# Patient Record
Sex: Male | Born: 1955 | Race: White | Hispanic: No | Marital: Married | Smoking: Never smoker
Health system: Southern US, Community
[De-identification: ages and names within clinical notes are randomized; demographics above are authoritative.]

## PROBLEM LIST (undated history)

## (undated) DIAGNOSIS — E785 Hyperlipidemia, unspecified: Secondary | ICD-10-CM

## (undated) DIAGNOSIS — M5430 Sciatica, unspecified side: Secondary | ICD-10-CM

## (undated) DIAGNOSIS — J309 Allergic rhinitis, unspecified: Secondary | ICD-10-CM

## (undated) DIAGNOSIS — M1990 Unspecified osteoarthritis, unspecified site: Secondary | ICD-10-CM

## (undated) DIAGNOSIS — F419 Anxiety disorder, unspecified: Secondary | ICD-10-CM

## (undated) DIAGNOSIS — G4700 Insomnia, unspecified: Secondary | ICD-10-CM

## (undated) DIAGNOSIS — I10 Essential (primary) hypertension: Secondary | ICD-10-CM

## (undated) HISTORY — DX: Sciatica, unspecified side: M54.30

## (undated) HISTORY — DX: Hyperlipidemia, unspecified: E78.5

## (undated) HISTORY — DX: Insomnia, unspecified: G47.00

## (undated) HISTORY — PX: LUMBAR LAMINECTOMY: SHX95

## (undated) HISTORY — DX: Essential (primary) hypertension: I10

## (undated) HISTORY — DX: Unspecified osteoarthritis, unspecified site: M19.90

## (undated) HISTORY — DX: Allergic rhinitis, unspecified: J30.9

## (undated) HISTORY — DX: Anxiety disorder, unspecified: F41.9

---
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
## 1999-03-03 ENCOUNTER — Ambulatory Visit (HOSPITAL_BASED_OUTPATIENT_CLINIC_OR_DEPARTMENT_OTHER): Admission: RE | Admit: 1999-03-03 | Discharge: 1999-03-03 | Payer: Self-pay | Admitting: *Deleted

## 1999-03-08 ENCOUNTER — Emergency Department (HOSPITAL_COMMUNITY): Admission: EM | Admit: 1999-03-08 | Discharge: 1999-03-08 | Payer: Self-pay | Admitting: Emergency Medicine

## 1999-03-08 ENCOUNTER — Encounter: Payer: Self-pay | Admitting: Emergency Medicine

## 2000-04-15 ENCOUNTER — Encounter: Admission: RE | Admit: 2000-04-15 | Discharge: 2000-04-15 | Payer: Self-pay | Admitting: Family Medicine

## 2000-04-15 ENCOUNTER — Encounter: Payer: Self-pay | Admitting: Family Medicine

## 2000-04-16 ENCOUNTER — Encounter: Payer: Self-pay | Admitting: Family Medicine

## 2000-04-16 ENCOUNTER — Encounter: Admission: RE | Admit: 2000-04-16 | Discharge: 2000-04-16 | Payer: Self-pay | Admitting: Family Medicine

## 2000-04-29 ENCOUNTER — Encounter: Payer: Self-pay | Admitting: Neurosurgery

## 2000-04-29 ENCOUNTER — Ambulatory Visit (HOSPITAL_COMMUNITY): Admission: RE | Admit: 2000-04-29 | Discharge: 2000-04-29 | Payer: Self-pay | Admitting: Neurosurgery

## 2000-05-17 ENCOUNTER — Ambulatory Visit (HOSPITAL_COMMUNITY): Admission: RE | Admit: 2000-05-17 | Discharge: 2000-05-17 | Payer: Self-pay | Admitting: Neurosurgery

## 2000-05-17 ENCOUNTER — Encounter: Payer: Self-pay | Admitting: Neurosurgery

## 2000-05-31 ENCOUNTER — Ambulatory Visit (HOSPITAL_COMMUNITY): Admission: RE | Admit: 2000-05-31 | Discharge: 2000-05-31 | Payer: Self-pay | Admitting: Neurosurgery

## 2000-05-31 ENCOUNTER — Encounter: Payer: Self-pay | Admitting: Neurosurgery

## 2000-06-17 ENCOUNTER — Ambulatory Visit (HOSPITAL_COMMUNITY): Admission: RE | Admit: 2000-06-17 | Discharge: 2000-06-17 | Payer: Self-pay | Admitting: Neurosurgery

## 2000-06-17 ENCOUNTER — Encounter: Payer: Self-pay | Admitting: Neurosurgery

## 2004-08-28 ENCOUNTER — Ambulatory Visit (HOSPITAL_COMMUNITY): Admission: RE | Admit: 2004-08-28 | Discharge: 2004-08-28 | Payer: Self-pay | Admitting: Family Medicine

## 2014-05-23 ENCOUNTER — Ambulatory Visit
Admission: RE | Admit: 2014-05-23 | Discharge: 2014-05-23 | Disposition: A | Discharge status: Home / Self Care | Payer: BC Managed Care – PPO | Source: Ambulatory Visit | Attending: Family Medicine | Admitting: Family Medicine

## 2014-05-23 ENCOUNTER — Other Ambulatory Visit: Payer: Self-pay

## 2014-05-23 ENCOUNTER — Other Ambulatory Visit: Payer: Self-pay | Admitting: Family Medicine

## 2014-05-23 DIAGNOSIS — R05 Cough: Secondary | ICD-10-CM

## 2014-05-23 DIAGNOSIS — R059 Cough, unspecified: Secondary | ICD-10-CM

## 2015-12-23 DIAGNOSIS — E782 Mixed hyperlipidemia: Secondary | ICD-10-CM | POA: Diagnosis not present

## 2015-12-23 DIAGNOSIS — I10 Essential (primary) hypertension: Secondary | ICD-10-CM | POA: Diagnosis not present

## 2015-12-23 DIAGNOSIS — R05 Cough: Secondary | ICD-10-CM | POA: Diagnosis not present

## 2016-01-02 DIAGNOSIS — Z0100 Encounter for examination of eyes and vision without abnormal findings: Secondary | ICD-10-CM | POA: Diagnosis not present

## 2016-04-21 DIAGNOSIS — Z23 Encounter for immunization: Secondary | ICD-10-CM | POA: Diagnosis not present

## 2016-06-28 DIAGNOSIS — I10 Essential (primary) hypertension: Secondary | ICD-10-CM | POA: Diagnosis not present

## 2016-06-28 DIAGNOSIS — Z0000 Encounter for general adult medical examination without abnormal findings: Secondary | ICD-10-CM | POA: Diagnosis not present

## 2016-06-28 DIAGNOSIS — E782 Mixed hyperlipidemia: Secondary | ICD-10-CM | POA: Diagnosis not present

## 2016-06-28 DIAGNOSIS — R05 Cough: Secondary | ICD-10-CM | POA: Diagnosis not present

## 2016-06-28 DIAGNOSIS — G4700 Insomnia, unspecified: Secondary | ICD-10-CM | POA: Diagnosis not present

## 2017-01-06 DIAGNOSIS — Z0100 Encounter for examination of eyes and vision without abnormal findings: Secondary | ICD-10-CM | POA: Diagnosis not present

## 2017-05-25 DIAGNOSIS — Z23 Encounter for immunization: Secondary | ICD-10-CM | POA: Diagnosis not present

## 2017-07-04 DIAGNOSIS — Z1159 Encounter for screening for other viral diseases: Secondary | ICD-10-CM | POA: Diagnosis not present

## 2017-07-04 DIAGNOSIS — G4700 Insomnia, unspecified: Secondary | ICD-10-CM | POA: Diagnosis not present

## 2017-07-04 DIAGNOSIS — Z1211 Encounter for screening for malignant neoplasm of colon: Secondary | ICD-10-CM | POA: Diagnosis not present

## 2017-07-04 DIAGNOSIS — E782 Mixed hyperlipidemia: Secondary | ICD-10-CM | POA: Diagnosis not present

## 2017-07-04 DIAGNOSIS — I10 Essential (primary) hypertension: Secondary | ICD-10-CM | POA: Diagnosis not present

## 2017-07-04 DIAGNOSIS — Z23 Encounter for immunization: Secondary | ICD-10-CM | POA: Diagnosis not present

## 2017-07-04 DIAGNOSIS — Z0000 Encounter for general adult medical examination without abnormal findings: Secondary | ICD-10-CM | POA: Diagnosis not present

## 2017-08-02 DIAGNOSIS — G4733 Obstructive sleep apnea (adult) (pediatric): Secondary | ICD-10-CM | POA: Diagnosis not present

## 2017-08-12 DIAGNOSIS — G4733 Obstructive sleep apnea (adult) (pediatric): Secondary | ICD-10-CM | POA: Diagnosis not present

## 2017-08-22 DIAGNOSIS — Z1211 Encounter for screening for malignant neoplasm of colon: Secondary | ICD-10-CM | POA: Diagnosis not present

## 2017-08-22 DIAGNOSIS — K5730 Diverticulosis of large intestine without perforation or abscess without bleeding: Secondary | ICD-10-CM | POA: Diagnosis not present

## 2017-08-22 DIAGNOSIS — D120 Benign neoplasm of cecum: Secondary | ICD-10-CM | POA: Diagnosis not present

## 2017-09-12 DIAGNOSIS — G4733 Obstructive sleep apnea (adult) (pediatric): Secondary | ICD-10-CM | POA: Diagnosis not present

## 2017-10-10 DIAGNOSIS — G4733 Obstructive sleep apnea (adult) (pediatric): Secondary | ICD-10-CM | POA: Diagnosis not present

## 2017-11-01 DIAGNOSIS — G4733 Obstructive sleep apnea (adult) (pediatric): Secondary | ICD-10-CM | POA: Diagnosis not present

## 2017-11-17 DIAGNOSIS — G4733 Obstructive sleep apnea (adult) (pediatric): Secondary | ICD-10-CM | POA: Diagnosis not present

## 2017-12-10 DIAGNOSIS — G4733 Obstructive sleep apnea (adult) (pediatric): Secondary | ICD-10-CM | POA: Diagnosis not present

## 2018-01-10 DIAGNOSIS — G4733 Obstructive sleep apnea (adult) (pediatric): Secondary | ICD-10-CM | POA: Diagnosis not present

## 2018-02-09 DIAGNOSIS — G4733 Obstructive sleep apnea (adult) (pediatric): Secondary | ICD-10-CM | POA: Diagnosis not present

## 2018-03-01 DIAGNOSIS — G4733 Obstructive sleep apnea (adult) (pediatric): Secondary | ICD-10-CM | POA: Diagnosis not present

## 2018-03-12 DIAGNOSIS — G4733 Obstructive sleep apnea (adult) (pediatric): Secondary | ICD-10-CM | POA: Diagnosis not present

## 2018-05-02 DIAGNOSIS — Z23 Encounter for immunization: Secondary | ICD-10-CM | POA: Diagnosis not present

## 2018-10-12 DIAGNOSIS — G4700 Insomnia, unspecified: Secondary | ICD-10-CM | POA: Diagnosis not present

## 2018-10-12 DIAGNOSIS — E782 Mixed hyperlipidemia: Secondary | ICD-10-CM | POA: Diagnosis not present

## 2018-10-12 DIAGNOSIS — I10 Essential (primary) hypertension: Secondary | ICD-10-CM | POA: Diagnosis not present

## 2018-10-12 DIAGNOSIS — K219 Gastro-esophageal reflux disease without esophagitis: Secondary | ICD-10-CM | POA: Diagnosis not present

## 2019-02-08 DIAGNOSIS — G4733 Obstructive sleep apnea (adult) (pediatric): Secondary | ICD-10-CM | POA: Diagnosis not present

## 2019-02-13 DIAGNOSIS — H02889 Meibomian gland dysfunction of unspecified eye, unspecified eyelid: Secondary | ICD-10-CM | POA: Diagnosis not present

## 2019-04-23 DIAGNOSIS — Z23 Encounter for immunization: Secondary | ICD-10-CM | POA: Diagnosis not present

## 2019-04-23 DIAGNOSIS — Z125 Encounter for screening for malignant neoplasm of prostate: Secondary | ICD-10-CM | POA: Diagnosis not present

## 2019-04-23 DIAGNOSIS — K219 Gastro-esophageal reflux disease without esophagitis: Secondary | ICD-10-CM | POA: Diagnosis not present

## 2019-04-23 DIAGNOSIS — G4700 Insomnia, unspecified: Secondary | ICD-10-CM | POA: Diagnosis not present

## 2019-04-23 DIAGNOSIS — I10 Essential (primary) hypertension: Secondary | ICD-10-CM | POA: Diagnosis not present

## 2019-04-23 DIAGNOSIS — E782 Mixed hyperlipidemia: Secondary | ICD-10-CM | POA: Diagnosis not present

## 2019-04-24 ENCOUNTER — Other Ambulatory Visit: Payer: Self-pay | Admitting: Family Medicine

## 2019-04-24 DIAGNOSIS — K219 Gastro-esophageal reflux disease without esophagitis: Secondary | ICD-10-CM

## 2019-05-01 ENCOUNTER — Ambulatory Visit
Admission: RE | Admit: 2019-05-01 | Discharge: 2019-05-01 | Disposition: A | Discharge status: Home / Self Care | Payer: BC Managed Care – PPO | Source: Ambulatory Visit | Attending: Family Medicine | Admitting: Family Medicine

## 2019-05-01 ENCOUNTER — Other Ambulatory Visit: Payer: Self-pay | Admitting: Family Medicine

## 2019-05-01 DIAGNOSIS — K224 Dyskinesia of esophagus: Secondary | ICD-10-CM | POA: Diagnosis not present

## 2019-05-01 DIAGNOSIS — I10 Essential (primary) hypertension: Secondary | ICD-10-CM | POA: Diagnosis not present

## 2019-05-01 DIAGNOSIS — K219 Gastro-esophageal reflux disease without esophagitis: Secondary | ICD-10-CM

## 2019-05-01 DIAGNOSIS — K2100 Gastro-esophageal reflux disease with esophagitis, without bleeding: Secondary | ICD-10-CM | POA: Diagnosis not present

## 2019-05-01 DIAGNOSIS — K225 Diverticulum of esophagus, acquired: Secondary | ICD-10-CM | POA: Diagnosis not present

## 2019-09-24 DIAGNOSIS — G4733 Obstructive sleep apnea (adult) (pediatric): Secondary | ICD-10-CM | POA: Diagnosis not present

## 2019-10-16 DIAGNOSIS — G4700 Insomnia, unspecified: Secondary | ICD-10-CM | POA: Diagnosis not present

## 2019-10-16 DIAGNOSIS — Z125 Encounter for screening for malignant neoplasm of prostate: Secondary | ICD-10-CM | POA: Diagnosis not present

## 2019-10-16 DIAGNOSIS — Z0000 Encounter for general adult medical examination without abnormal findings: Secondary | ICD-10-CM | POA: Diagnosis not present

## 2019-10-16 DIAGNOSIS — E782 Mixed hyperlipidemia: Secondary | ICD-10-CM | POA: Diagnosis not present

## 2019-10-16 DIAGNOSIS — K219 Gastro-esophageal reflux disease without esophagitis: Secondary | ICD-10-CM | POA: Diagnosis not present

## 2019-10-16 DIAGNOSIS — I10 Essential (primary) hypertension: Secondary | ICD-10-CM | POA: Diagnosis not present

## 2020-06-15 IMAGING — RF DG ESOPHAGUS
10 of 11 series · 14 of 24 positions shown · non-contrast
Comparison: None.

CLINICAL DATA: Gastroesophageal reflux disease. Excessive throat
clearing with sensation of food sticking in the lower chest.

EXAM:
ESOPHOGRAM / BARIUM SWALLOW / BARIUM TABLET STUDY
TECHNIQUE: Combined double contrast and single contrast examination performed
using effervescent crystals, thick barium liquid, and thin barium
liquid. The patient was observed with fluoroscopy swallowing a 13 mm
barium sulphate tablet.
FLUOROSCOPY TIME:  Fluoroscopy Time:  2 minutes 12 seconds
Radiation Exposure Index (if provided by the fluoroscopic device):
91 mGy
Number of Acquired Spot Images: 10

[Series 1: sequence · 1 of 16 frames shown (1 of 7)]
[frame 3/16]
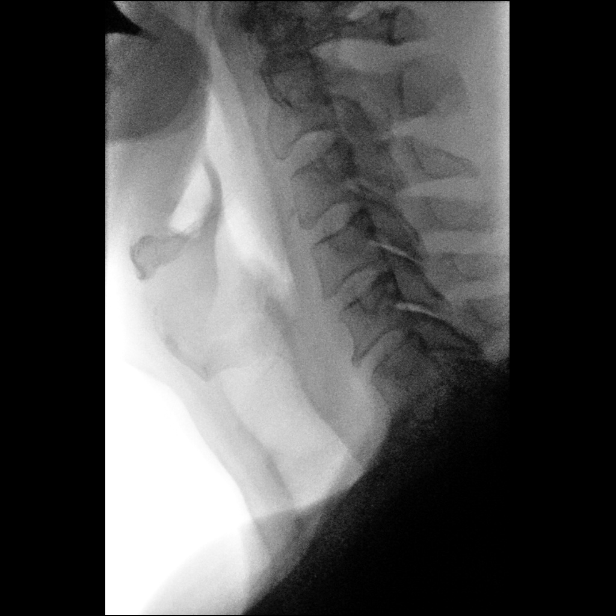

[Series 2: one shot · 0.15mm/px · 1 of 1 slices shown (1 of 3)]
[im 1/1]
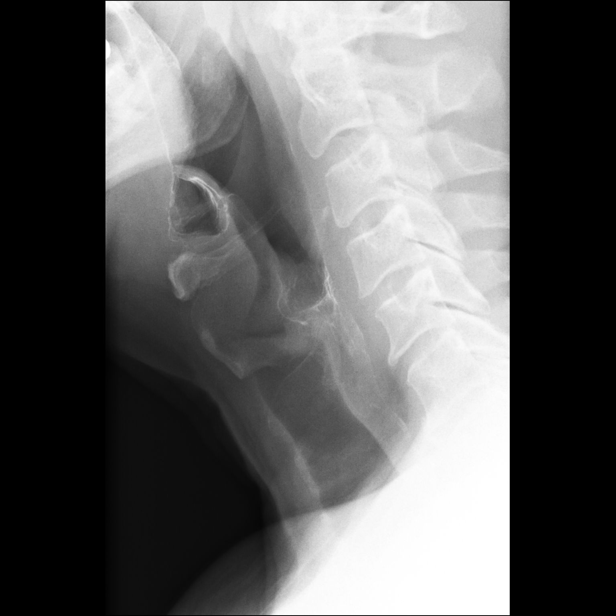

[Series 3: sequence · 1 of 21 frames shown (2 of 7)]
[frame 18/21]
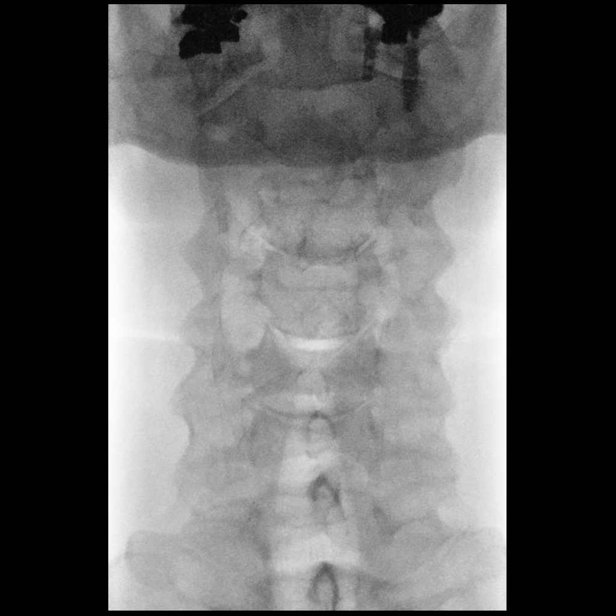

[Series 4: one shot · 0.15mm/px · 3 of 8 slices shown (2 of 3)]
[im 2/8]
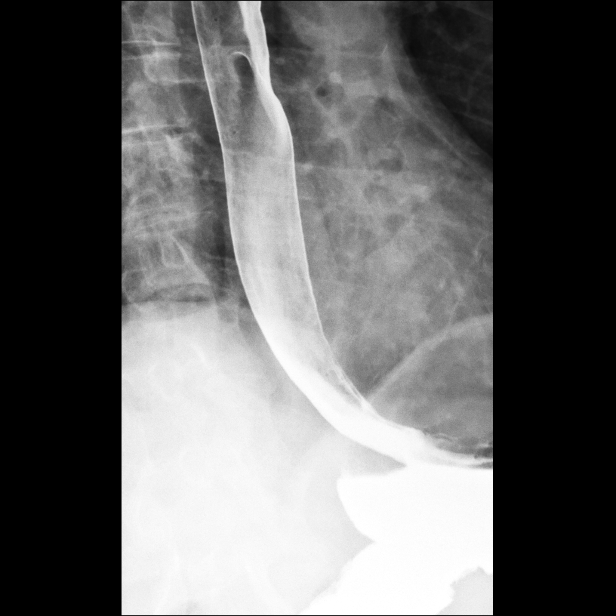
[im 4/8]
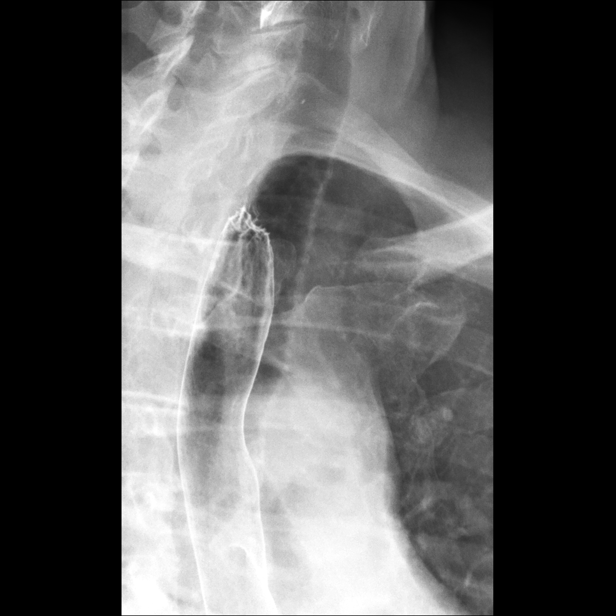
[im 7/8]
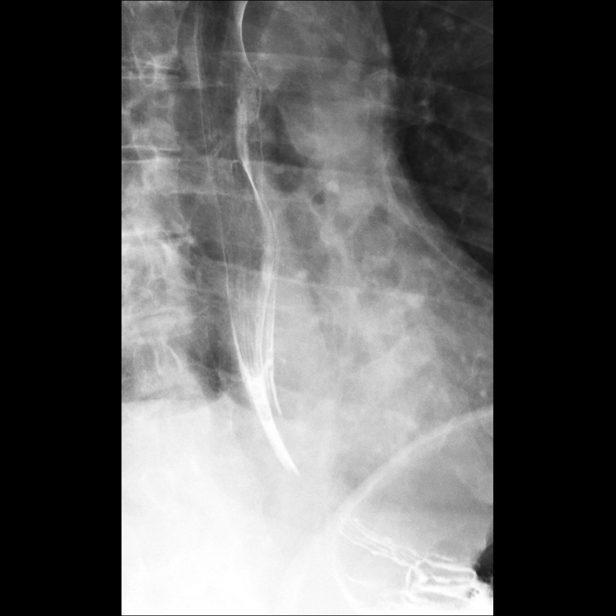

[Series 5: sequence · 1 of 27 frames shown (3 of 7)]
[frame 14/27]
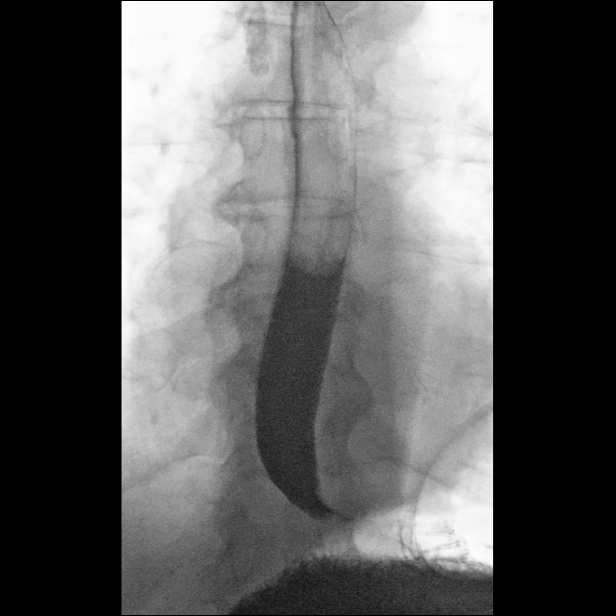

[Series 6: one shot · 0.15mm/px · 1 of 1 slices shown (3 of 3)]
[im 1/1]
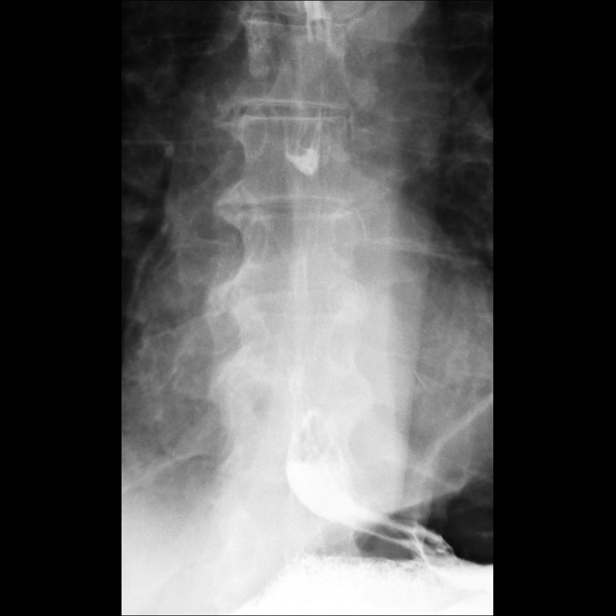

[Series 7: sequence · 1 of 4 frames shown (4 of 7)]
[frame 4/4]
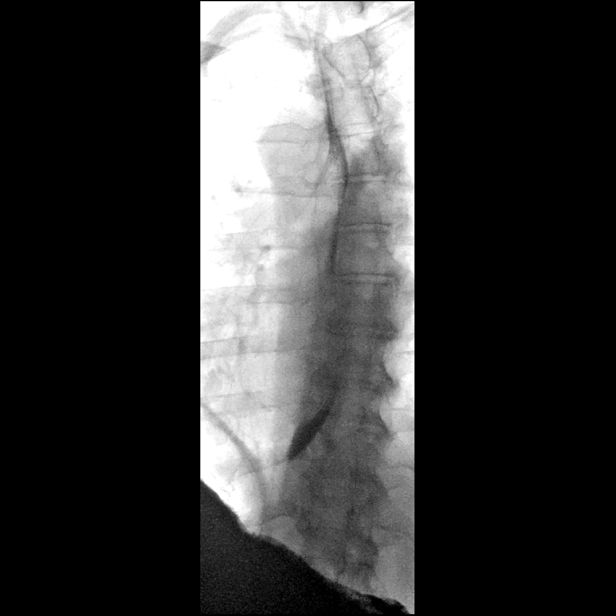

[Series 8: sequence · 1 of 51 frames shown (5 of 7)]
[frame 26/51]
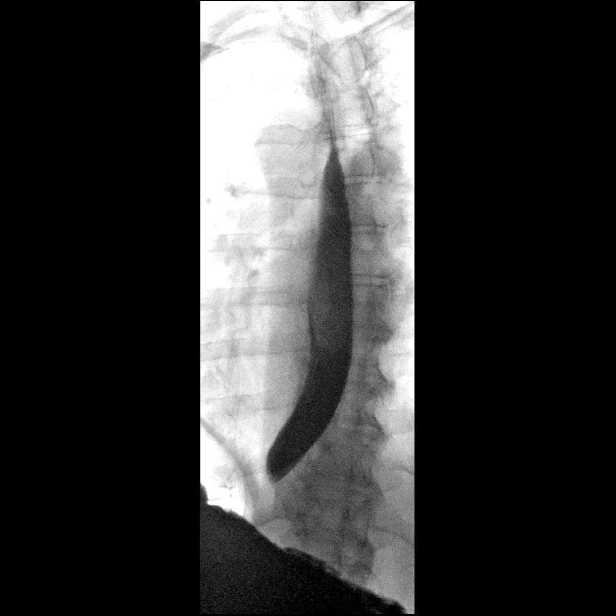

[Series 9: sequence · 2 of 114 frames shown (6 of 7)]
[frame 18/114]
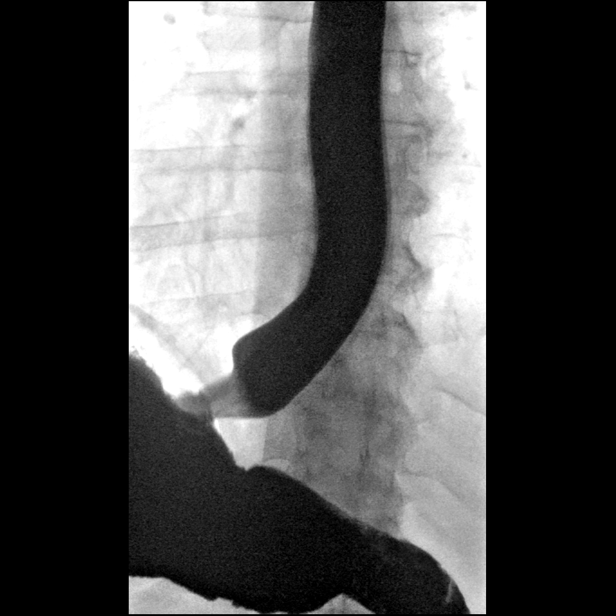
[frame 97/114]
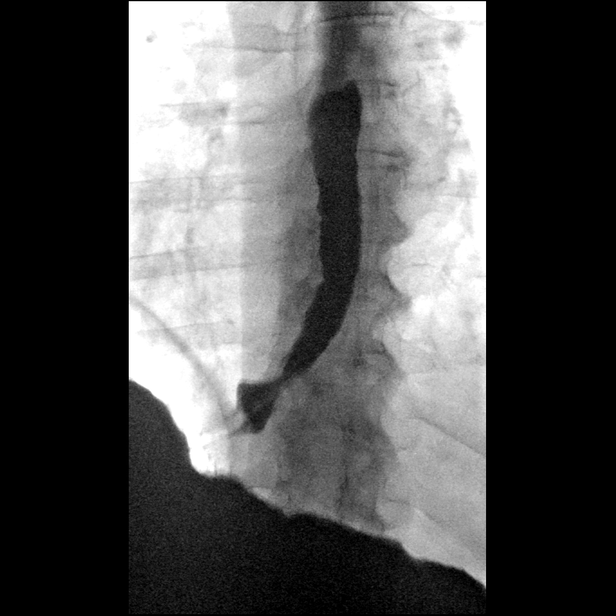

[Series 12: sequence · 2 of 15 frames shown (7 of 7)]
[frame 3/15]
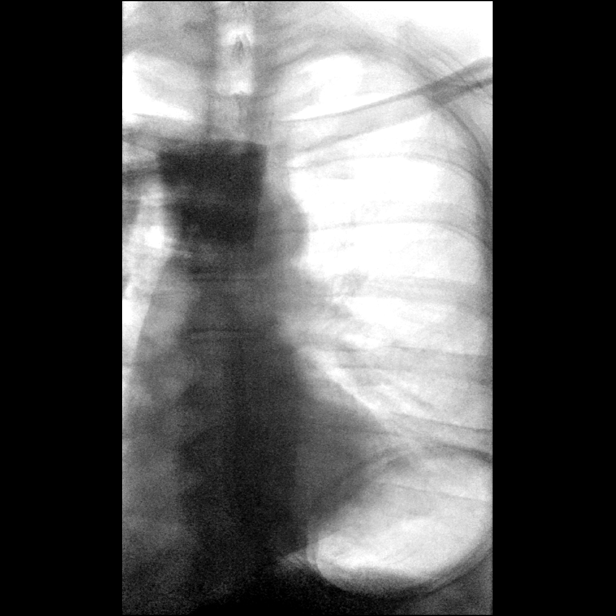
[frame 15/15]
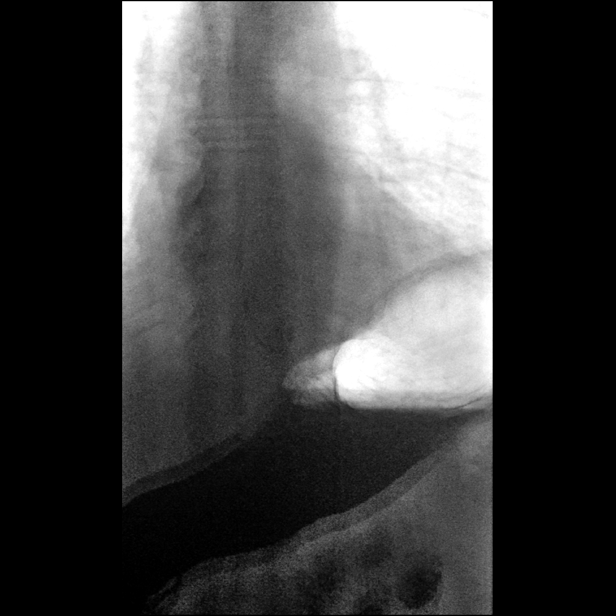

[14 of 24 positions shown; findings below may reference images not displayed]

FINDINGS: Normal oral and pharyngeal phases of swallowing, with no laryngeal
penetration or tracheobronchial aspiration. No significant barium
retention in the pharynx. No evidence of pharyngeal mass, stricture
or diverticulum. No evidence of cricopharyngeus muscle dysfunction.

There is a small traction diverticulum in the right mid thoracic
esophagus, just below the level of the carina.

Mild esophageal dysmotility, with intermittent mild weakening of
primary peristalsis in the lower thoracic esophagus. No hiatal
hernia. Moderate to marked gastroesophageal reflux elicited to the
level of the thoracic inlet with water siphon test. Mild granularity
of the esophageal mucosa in the mid to lower thoracic esophagus
suggests mild reflux esophagitis. No evidence of esophageal mass,
stricture or ulcer. Barium tablet traversed the esophagus into the
stomach without delay.
IMPRESSION: 1. Moderate-to-marked gastroesophageal reflux elicited. No hiatal
hernia.
2. Mild esophageal dysmotility, with chronic reflux related
dysmotility pattern.
3. Evidence of mild reflux esophagitis in the mid to lower thoracic
esophagus. No evidence of esophageal mass, stricture or ulcer.
4. Small traction diverticulum in the right mid thoracic esophagus.

## 2022-11-04 ENCOUNTER — Other Ambulatory Visit: Payer: Self-pay | Admitting: Family Medicine

## 2022-11-04 ENCOUNTER — Ambulatory Visit
Admission: RE | Admit: 2022-11-04 | Discharge: 2022-11-04 | Disposition: A | Discharge status: Home / Self Care | Payer: Medicare Other | Source: Ambulatory Visit | Attending: Family Medicine | Admitting: Family Medicine

## 2022-11-04 DIAGNOSIS — R0609 Other forms of dyspnea: Secondary | ICD-10-CM

## 2022-11-18 ENCOUNTER — Ambulatory Visit: Payer: Medicare Other | Admitting: Cardiology

## 2022-11-18 ENCOUNTER — Encounter: Payer: Self-pay | Admitting: Cardiology

## 2022-11-18 VITALS — BP 147/79 | HR 69 | Resp 16 | Ht 73.0 in | Wt 218.4 lb

## 2022-11-18 DIAGNOSIS — R0609 Other forms of dyspnea: Secondary | ICD-10-CM

## 2022-11-18 DIAGNOSIS — Z8249 Family history of ischemic heart disease and other diseases of the circulatory system: Secondary | ICD-10-CM

## 2022-11-18 DIAGNOSIS — E7800 Pure hypercholesterolemia, unspecified: Secondary | ICD-10-CM

## 2022-11-18 DIAGNOSIS — I10 Essential (primary) hypertension: Secondary | ICD-10-CM

## 2022-11-18 MED ORDER — AMLODIPINE BESYLATE 10 MG PO TABS: 10.0000 mg | ORAL_TABLET | Freq: Every evening | ORAL | 0 refills | Status: DC

## 2022-11-18 MED ORDER — VALSARTAN-HYDROCHLOROTHIAZIDE 320-25 MG PO TABS
1.0000 | ORAL_TABLET | ORAL | 0 refills | Status: AC
Start: 2022-11-18 — End: ?

## 2022-11-18 NOTE — Progress Notes (Signed)
Primary Physician/Referring:  Lazarus Salines, MD  Patient ID: Joseph Small, male    DOB: [birth year], 67 y.o.   MRN: [id number]  Chief Complaint  Patient presents with   Dyspnea on exertion   New Patient (Initial Visit)    Referred by Campbell [facility]   HPI:    Joseph Small  is a 67 y.o.Caucasian male patient with hypertension, mixed hypercholesterolemia, impaired fasting glucose, erectile dysfunction, family history of premature coronary disease, OSA on CPAP, moderate to marked GERD and mild esophageal dysmotility referred to me for evaluation of dyspnea on exertion.  Patient does not have a routine exercise program although he does do heavy exertional activities and also still is working full-time and lifts heavy objects without significant discomfort.  He has not had any chest pain.  Dyspnea is described as very mild.  No PND or orthopnea or leg edema.  Smoker.  Past Medical History:  Diagnosis Date   Allergic rhinitis    Anxiety disorder    Hyperlipidemia    Hypertension    Insomnia    Osteoarthritis    hands,shoulders,knees   Sciatica    right leg   Past Surgical History:  Procedure Laterality Date   APPENDECTOMY  1966   LUMBAR LAMINECTOMY     Family History  Problem Relation Age of Onset   Cancer Mother    Heart attack Father 52   Heart disease Father    Heart failure Father    Heart attack Brother    Heart disease Brother 33   Heart attack Brother 19    Social History   Tobacco Use   Smoking status: Never   Smokeless tobacco: Not on file  Substance Use Topics   Alcohol use: Yes    Alcohol/week: 3.0 standard drinks of alcohol    Types: 1 Glasses of wine, 1 Cans of beer, 1 Shots of liquor per week   Marital Status: Single  ROS  Review of Systems  Cardiovascular:  Positive for dyspnea on exertion. Negative for chest pain and leg swelling.   Objective      11/18/2022    9:05 AM 11/18/2022    8:59 AM  Vitals with BMI  Height  6'  1"  Weight  218 lbs 6 oz  BMI  28.82  Systolic 147 153  Diastolic 79 90  Pulse 69 70   SpO2: 98 %  Physical Exam Neck:     Vascular: No carotid bruit or JVD.  Cardiovascular:     Rate and Rhythm: Normal rate and regular rhythm.     Pulses: Intact distal pulses.     Heart sounds: Normal heart sounds. No murmur heard.    No gallop.  Pulmonary:     Effort: Pulmonary effort is normal.     Breath sounds: Normal breath sounds.  Abdominal:     General: Bowel sounds are normal.     Palpations: Abdomen is soft.  Musculoskeletal:     Right lower leg: No edema.     Left lower leg: No edema.     Laboratory examination:   External labs:   Labs 11/01/2022:  Total cholesterol 194, triglycerides 83, HDL 81, LDL 98.  Non-HDL cholesterol 113.  Serum glucose 108 mg, BUN 18, creatinine 0.79, EGFR 97 mL, potassium 4.5, LFTs normal.  Hb 14.3/HCT 41.5, platelets 148.  Radiology:   Chest x-ray two-view 11/04/2022: Mediastinum and hilar structures normal. Heart size normal. Lungs are clear of acute infiltrates. Stable left base pleuroparenchymal scarring  again noted. Degenerative changes scoliosis thoracic spine.  Cardiac Studies:  NA  EKG:   EKG 11/18/2022: Normal sinus rhythm at rate of 70 bpm, normal axis, no evidence of ischemia, normal EKG.   Medications and allergies   Allergies  Allergen Reactions   Erythromycin    Penicillins      Medication list   Current Outpatient Medications:    famotidine (PEPCID) 40 MG tablet, Take 40 mg by mouth daily., Disp: , Rfl:    finasteride (PROPECIA) 1 MG tablet, Take 1 mg by mouth daily., Disp: , Rfl:    fluticasone (FLONASE) 50 MCG/ACT nasal spray, Place 1 spray into both nostrils daily., Disp: , Rfl:    naproxen sodium (ANAPROX) 550 MG tablet, Take 550 mg by mouth 2 (two) times daily as needed for moderate pain., Disp: , Rfl:    Omega-3 Fatty Acids (FISH OIL) 1000 MG CAPS, Take by mouth., Disp: , Rfl:    pravastatin (PRAVACHOL) 80 MG  tablet, Take 80 mg by mouth daily., Disp: , Rfl:    sildenafil (VIAGRA) 100 MG tablet, Take 100 mg by mouth daily as needed for erectile dysfunction., Disp: , Rfl:    tadalafil (CIALIS) 5 MG tablet, Take 5 mg by mouth daily., Disp: , Rfl:    valsartan-hydrochlorothiazide (DIOVAN-HCT) 320-25 MG tablet, Take 1 tablet by mouth every morning., Disp: 90 tablet, Rfl: 0   vitamin B-12 (CYANOCOBALAMIN) 100 MCG tablet, Take 100 mcg by mouth daily., Disp: , Rfl:    zolpidem (AMBIEN) 10 MG tablet, Take 10 mg by mouth at bedtime as needed for sleep., Disp: , Rfl:    amLODipine (NORVASC) 10 MG tablet, Take 1 tablet (10 mg total) by mouth every evening., Disp: 90 tablet, Rfl: 0  Assessment     ICD-10-CM   1. Dyspnea on exertion  R06.09 EKG 12-Lead    PCV CARDIAC STRESS TEST    PCV ECHOCARDIOGRAM COMPLETE    CANCELED: PCV ECHOCARDIOGRAM COMPLETE W BUBBLE    2. Primary hypertension  I10 amLODipine (NORVASC) 10 MG tablet    valsartan-hydrochlorothiazide (DIOVAN-HCT) 320-25 MG tablet    3. Pure hypercholesterolemia  E78.00     4. Family history of premature CAD: Father MI at at 24, 2 Brothers with MI late 30 Years,  Z82.49        Orders Placed This Encounter  Procedures   PCV CARDIAC STRESS TEST    Standing Status:   Future    Standing Expiration Date:   01/18/2023   EKG 12-Lead   PCV ECHOCARDIOGRAM COMPLETE    Standing Status:   Future    Standing Expiration Date:   11/18/2023    Meds ordered this encounter  Medications   amLODipine (NORVASC) 10 MG tablet    Sig: Take 1 tablet (10 mg total) by mouth every evening.    Dispense:  90 tablet    Refill:  0    Refills to Dr. Campbell Lerner [facility]   valsartan-hydrochlorothiazide (DIOVAN-HCT) 320-25 MG tablet    Sig: Take 1 tablet by mouth every morning.    Dispense:  90 tablet    Refill:  0    Refills to Dr. Campbell Lerner [facility]    Medications Discontinued During This Encounter  Medication Reason   omeprazole (PRILOSEC) 20 MG capsule    aspirin 81  MG tablet Discontinued by provider   valsartan-hydrochlorothiazide (DIOVAN-HCT) 320-12.5 MG per tablet Dose change   amLODipine (NORVASC) 5 MG tablet Reorder     Recommendations:   Joseph Small  is a 67 y.o. Caucasian male patient with hypertension, mixed hypercholesterolemia, impaired fasting glucose, erectile dysfunction, family history of premature coronary disease, OSA on CPAP, moderate to marked GERD and mild esophageal dysmotility referred to me for evaluation of dyspnea on exertion.  1. Dyspnea on exertion Patient does not have an active exercise program although he works fairly intensely and lifts heavy objects without any significant discomfort.  Dyspnea is mild and may be related to uncontrolled hypertension.  However in view of strong family history of premature coronary disease, his age, hypertension and hypercholesterolemia, will schedule for a routine treadmill exercise stress test along with an echocardiogram.  - EKG 12-Lead - PCV ECHOCARDIOGRAM COMPLETE; Future - PCV CARDIAC STRESS TEST; Future  2. Primary hypertension Blood pressure is elevated, it was also elevated in PCP office, I have increased the dose of the valsartan HCT from 320/12.5 mg to 320/25 mg in the morning and will also increase amlodipine from 2.5 mg in the morning to 10 mg in the evening.  Will recheck his hypertension on his next office visit in 8 to 10 weeks.  - amLODipine (NORVASC) 10 MG tablet; Take 1 tablet (10 mg total) by mouth every evening.  Dispense: 90 tablet; Refill: 0 - valsartan-hydrochlorothiazide (DIOVAN-HCT) 320-25 MG tablet; Take 1 tablet by mouth every morning.  Dispense: 90 tablet; Refill: 0  3. Pure hypercholesterolemia Lipids in excellent control, patient is presently on moderate intensity but high-dose pravastatin, in view of excellent control of LDL, continue the same.  4. Family history of premature CAD: Father MI at at 45, 2 Brothers with MI late 19 Years, Patient has family  still premature coronary disease, fortunately he is a non-smoker.  His brothers did smoke.  However he has not been scheduled for a routine treadmill exercise stress test and will evaluate his functional capacity as well.  For now continue primary prevention.  No indication for aspirin in the absence of known vascular disease.   Joseph Decamp, MD, [facility] 11/18/2022, 9:52 AM Office: [phone]

## 2022-12-10 ENCOUNTER — Ambulatory Visit: Payer: Medicare Other

## 2022-12-10 DIAGNOSIS — R0609 Other forms of dyspnea: Secondary | ICD-10-CM

## 2022-12-13 NOTE — Progress Notes (Signed)
Exercise treadmill stress test 12/10/2022: Exercise treadmill stress test performed using Bruce protocol.  Patient exercised for a total of 9 minutes and 7 seconds, achieving 10.3 METS, and 115% of age predicted maximum heart rate.  Exercise capacity was excellent.  3/10 non limiting chest pain reported. Normal heart rate and hemodynamic response. Resting: 140 / 90 (mmHg). Peak: 160 / 80 (mmHg). Stress EKG revealed no ischemic changes. No ischemic changes correlated with non-limiting chest pain. Recommend clinical correlation.

## 2022-12-17 ENCOUNTER — Ambulatory Visit: Payer: Medicare Other

## 2022-12-17 DIAGNOSIS — R0609 Other forms of dyspnea: Secondary | ICD-10-CM

## 2023-02-07 ENCOUNTER — Encounter: Payer: Self-pay | Admitting: Cardiology

## 2023-02-07 ENCOUNTER — Ambulatory Visit: Payer: Medicare Other | Admitting: Cardiology

## 2023-02-07 VITALS — BP 130/76 | HR 78 | Resp 16 | Ht 73.0 in | Wt 216.0 lb

## 2023-02-07 DIAGNOSIS — I10 Essential (primary) hypertension: Secondary | ICD-10-CM

## 2023-02-07 DIAGNOSIS — E7800 Pure hypercholesterolemia, unspecified: Secondary | ICD-10-CM

## 2023-02-07 DIAGNOSIS — R0609 Other forms of dyspnea: Secondary | ICD-10-CM

## 2023-02-07 NOTE — Progress Notes (Signed)
Primary Physician/Referring:  Deatra James, MD  Patient ID: Joseph Small, male    DOB: February 14, 1956, 67 y.o.   MRN: [id number]  Chief Complaint  Patient presents with   Shortness of Breath   Hypertension   Follow-up    3 month   Results    GXT and echo   HPI:    Joseph Small  is a 67 y.o.Caucasian male patient with hypertension, mixed hypercholesterolemia, impaired fasting glucose, erectile dysfunction, family history of premature coronary disease, OSA on CPAP, moderate to marked GERD and mild esophageal dysmotility referred to me for evaluation of dyspnea on exertion.  Patient does not have a routine exercise program although he does do heavy exertional activities and also still is working full-time and lifts heavy objects without significant discomfort.  Dyspnea is described as very mild.  No PND or orthopnea or leg edema.  He has not had any chest pain.  Non-smoker.  He underwent stress testing and echocardiogram and presents for follow-up.  He is tolerating increased dose of valsartan HCT and also increasing the dose of amlodipine.  Past Medical History:  Diagnosis Date   Allergic rhinitis    Anxiety disorder    Hyperlipidemia    Hypertension    Insomnia    Osteoarthritis    hands,shoulders,knees   Sciatica    right leg   Past Surgical History:  Procedure Laterality Date   APPENDECTOMY  1966   LUMBAR LAMINECTOMY     Family History  Problem Relation Age of Onset   Cancer Mother    Heart attack Father 23   Heart disease Father    Heart failure Father    Heart attack Brother    Heart disease Brother 32   Heart attack Brother 91    Social History   Tobacco Use   Smoking status: Never   Smokeless tobacco: Not on file  Substance Use Topics   Alcohol use: Yes    Alcohol/week: 3.0 standard drinks of alcohol    Types: 1 Glasses of wine, 1 Cans of beer, 1 Shots of liquor per week   Marital Status: Single  ROS  Review of Systems  Cardiovascular:   Positive for dyspnea on exertion. Negative for chest pain and leg swelling.   Objective      02/07/2023   12:51 PM 11/18/2022    9:05 AM 11/18/2022    8:59 AM  Vitals with BMI  Height 6\' 1"   6\' 1"   Weight 216 lbs  218 lbs 6 oz  BMI 28.5  28.82  Systolic 130 147 409  Diastolic 76 79 90  Pulse 78 69 70   SpO2: 96 %  Physical Exam Neck:     Vascular: No carotid bruit or JVD.  Cardiovascular:     Rate and Rhythm: Normal rate and regular rhythm.     Pulses: Intact distal pulses.     Heart sounds: Normal heart sounds. No murmur heard.    No gallop.  Pulmonary:     Effort: Pulmonary effort is normal.     Breath sounds: Normal breath sounds.  Abdominal:     General: Bowel sounds are normal.     Palpations: Abdomen is soft.  Musculoskeletal:     Right lower leg: No edema.     Left lower leg: No edema.     Laboratory examination:   External labs:   Labs 11/01/2022:  Total cholesterol 194, triglycerides 83, HDL 81, LDL 98.  Non-HDL cholesterol 113.  Serum  glucose 108 mg, BUN 18, creatinine 0.79, EGFR 97 mL, potassium 4.5, LFTs normal.  Hb 14.3/HCT 41.5, platelets 148.  Radiology:   Chest x-ray two-view 11/04/2022: Mediastinum and hilar structures normal. Heart size normal. Lungs are clear of acute infiltrates. Stable left base pleuroparenchymal scarring again noted. Degenerative changes scoliosis thoracic spine.  Cardiac Studies:   Exercise treadmill stress test 12/10/2022: Exercise treadmill stress test performed using Bruce protocol.  Patient exercised for a total of 9 minutes and 7 seconds, achieving 10.3 METS, and 115% of age predicted maximum heart rate.  Exercise capacity was excellent.  3/10 non limiting chest pain reported. Normal heart rate and hemodynamic response. Resting: 140 / 90 (mmHg). Peak: 160 / 80 (mmHg). Stress EKG revealed no ischemic changes. No ischemic changes correlated with non-limiting chest pain. Recommend clinical correlation.   Echocardiogram  12/17/2022: Normal LV systolic function with visual EF 60-65%. Left ventricle cavity is normal in size. Normal left ventricular wall thickness. Normal global wall motion. Normal diastolic filling pattern, normal LAP.  No significant valvular heart disease.  No prior study for comparison.  EKG:   EKG 11/18/2022: Normal sinus rhythm at rate of 70 bpm, normal axis, no evidence of ischemia, normal EKG.   Medications and allergies   Allergies  Allergen Reactions   Erythromycin    Penicillins      Medication list   Current Outpatient Medications:    amLODipine (NORVASC) 2.5 MG tablet, Take 5 mg by mouth every morning., Disp: , Rfl:    famotidine (PEPCID) 40 MG tablet, Take 40 mg by mouth daily., Disp: , Rfl:    finasteride (PROPECIA) 1 MG tablet, Take 1 mg by mouth daily., Disp: , Rfl:    fluticasone (FLONASE) 50 MCG/ACT nasal spray, Place 1 spray into both nostrils daily., Disp: , Rfl:    naproxen sodium (ANAPROX) 550 MG tablet, Take 550 mg by mouth 2 (two) times daily as needed for moderate pain., Disp: , Rfl:    Omega-3 Fatty Acids (FISH OIL) 1000 MG CAPS, Take by mouth., Disp: , Rfl:    pravastatin (PRAVACHOL) 80 MG tablet, Take 80 mg by mouth daily., Disp: , Rfl:    sildenafil (VIAGRA) 100 MG tablet, Take 100 mg by mouth daily as needed for erectile dysfunction., Disp: , Rfl:    tadalafil (CIALIS) 5 MG tablet, Take 5 mg by mouth daily., Disp: , Rfl:    valsartan-hydrochlorothiazide (DIOVAN-HCT) 320-25 MG tablet, Take 1 tablet by mouth every morning., Disp: 90 tablet, Rfl: 0   vitamin B-12 (CYANOCOBALAMIN) 100 MCG tablet, Take 100 mcg by mouth daily., Disp: , Rfl:    zolpidem (AMBIEN) 10 MG tablet, Take 10 mg by mouth at bedtime as needed for sleep., Disp: , Rfl:    Assessment     ICD-10-CM   1. Dyspnea on exertion  R06.09     2. Primary hypertension  I10     3. Pure hypercholesterolemia  E78.00      No orders of the defined types were placed in this encounter.  No orders of  the defined types were placed in this encounter.  Medications Discontinued During This Encounter  Medication Reason   amLODipine (NORVASC) 10 MG tablet      Recommendations:   Joseph Small is a 67 y.o. Caucasian male patient with hypertension, mixed hypercholesterolemia, impaired fasting glucose, erectile dysfunction, family history of premature coronary disease, OSA on CPAP, moderate to marked GERD and mild esophageal dysmotility referred to me for evaluation of dyspnea on  exertion.  1. Dyspnea on exertion I reviewed the results of the treadmill exercise stress test, he has excellent exercise tolerance.  No evidence of ischemia.  Nonlimiting chest pain is probably related to esophageal dysmotility and GERD and do not suspect angina pectoris.  Overall low risk stress test.  2. Primary hypertension Since increasing the dose of valsartan HCT from 320/12.5 mg to 320/25 mg in the morning, blood pressure is now well-controlled I also had increased his amlodipine to 10 mg daily however he is presently taking 25 mg daily with excellent control of his blood pressure.  Hence advised him to continue at 5 mg dose only and not increase it to 10.  3. Pure hypercholesterolemia With regard to hypercholesterolemia, LDL is <100.  In the absence of known vascular disease, HDL being high, we could continue the same for now with pravastatin at 80 mg daily.  He does have family history of premature coronary artery disease both his father and brother who had coronary artery disease in the early years were both smokers but patient is not.  Hence we will continue present management.  Long as LDL is <100 we should be fine.  He does not have diabetes either.  I will see him back on a as needed basis.    Joseph Decamp, MD, [facility] 02/07/2023, 1:17 PM Office: [phone]

## 2023-04-25 ENCOUNTER — Other Ambulatory Visit: Payer: Self-pay | Admitting: Family Medicine

## 2023-04-25 ENCOUNTER — Encounter: Payer: Self-pay | Admitting: Family Medicine

## 2023-04-25 DIAGNOSIS — M791 Myalgia, unspecified site: Secondary | ICD-10-CM

## 2023-04-25 DIAGNOSIS — R29898 Other symptoms and signs involving the musculoskeletal system: Secondary | ICD-10-CM

## 2023-05-06 ENCOUNTER — Other Ambulatory Visit: Payer: Self-pay | Admitting: Family Medicine

## 2023-05-06 DIAGNOSIS — R7989 Other specified abnormal findings of blood chemistry: Secondary | ICD-10-CM

## 2023-05-10 ENCOUNTER — Inpatient Hospital Stay
Admission: RE | Admit: 2023-05-10 | Discharge: 2023-05-10 | Discharge status: Home / Self Care | Payer: Medicare Other | Source: Ambulatory Visit | Attending: Family Medicine | Admitting: Family Medicine

## 2023-05-10 DIAGNOSIS — R7989 Other specified abnormal findings of blood chemistry: Secondary | ICD-10-CM

## 2023-05-12 ENCOUNTER — Other Ambulatory Visit: Payer: Medicare Other

## 2023-06-01 ENCOUNTER — Ambulatory Visit (HOSPITAL_BASED_OUTPATIENT_CLINIC_OR_DEPARTMENT_OTHER): Payer: Medicare Other | Admitting: Orthopaedic Surgery

## 2023-06-15 ENCOUNTER — Ambulatory Visit (HOSPITAL_BASED_OUTPATIENT_CLINIC_OR_DEPARTMENT_OTHER): Payer: Medicare Other | Admitting: Orthopaedic Surgery

## 2023-06-15 ENCOUNTER — Ambulatory Visit (HOSPITAL_BASED_OUTPATIENT_CLINIC_OR_DEPARTMENT_OTHER): Payer: Medicare Other

## 2023-06-15 DIAGNOSIS — M25512 Pain in left shoulder: Secondary | ICD-10-CM | POA: Diagnosis not present

## 2023-06-15 DIAGNOSIS — G8929 Other chronic pain: Secondary | ICD-10-CM | POA: Diagnosis not present

## 2023-06-15 NOTE — Progress Notes (Signed)
Chief Complaint: Left shoulder pain     History of Present Illness:    Joseph Small is a 67 y.o. male presents today for follow-up of persistent left shoulder pain as well as weakness with overhead.  He does have a catch approximately 30 degrees on the left shoulder with difficulty with limited overhead motion.  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  He is very active.  He was recently seen for cervical spine where they were concerned for polymyalgia rheumatica.  He has been placed on steroids which is helping his lower extremity pain but he does still have persistent left pain and overhead weakness.  He is right-hand dominant    PMH/PSH/Family History/Social History/Meds/Allergies:    Past Medical History:  Diagnosis Date   Allergic rhinitis    Anxiety disorder    Hyperlipidemia    Hypertension    Insomnia    Osteoarthritis    hands,shoulders,knees   Sciatica    right leg   Past Surgical History:  Procedure Laterality Date   APPENDECTOMY  1966   LUMBAR LAMINECTOMY     Social History   Socioeconomic History   Marital status: Married    Spouse name: Not on file   Number of children: 0   Years of education: Not on file   Highest education level: Not on file  Occupational History   Not on file  Tobacco Use   Smoking status: Never   Smokeless tobacco: Not on file  Vaping Use   Vaping status: Never Used  Substance and Sexual Activity   Alcohol use: Yes    Alcohol/week: 3.0 standard drinks of alcohol    Types: 1 Glasses of wine, 1 Cans of beer, 1 Shots of liquor per week   Drug use: Never   Sexual activity: Not on file  Other Topics Concern   Not on file  Social History Narrative   Not on file   Social Determinants of Health   Financial Resource Strain: Not on file  Food Insecurity: Not on file  Transportation Needs: Not on file  Physical Activity: Not on file  Stress: Not on file  Social Connections: Unknown (07/15/2022)    [facility], [facility]   Social Network    Social Network: Not on file   Family History  Problem Relation Age of Onset   Cancer Mother    Heart attack Father 31   Heart disease Father    Heart failure Father    Heart attack Brother    Heart disease Brother 81   Heart attack Brother 89   Allergies  Allergen Reactions   Erythromycin    Penicillins    Current Outpatient Medications  Medication Sig Dispense Refill   amLODipine (NORVASC) 2.5 MG tablet Take 5 mg by mouth every morning.     famotidine (PEPCID) 40 MG tablet Take 40 mg by mouth daily.     finasteride (PROPECIA) 1 MG tablet Take 1 mg by mouth daily.     fluticasone (FLONASE) 50 MCG/ACT nasal spray Place 1 spray into both nostrils daily.     naproxen sodium (ANAPROX) 550 MG tablet Take 550 mg by mouth 2 (two) times daily as needed for moderate pain.     Omega-3 Fatty Acids (FISH OIL) 1000 MG CAPS Take by mouth.     pravastatin (PRAVACHOL) 80 MG tablet Take 80 mg by mouth daily.     sildenafil (VIAGRA) 100 MG tablet Take 100 mg by  mouth daily as needed for erectile dysfunction.     tadalafil (CIALIS) 5 MG tablet Take 5 mg by mouth daily.     valsartan-hydrochlorothiazide (DIOVAN-HCT) 320-25 MG tablet Take 1 tablet by mouth every morning. 90 tablet 0   vitamin B-12 (CYANOCOBALAMIN) 100 MCG tablet Take 100 mcg by mouth daily.     zolpidem (AMBIEN) 10 MG tablet Take 10 mg by mouth at bedtime as needed for sleep.     No current facility-administered medications for this visit.   No results found.  Review of Systems:   A ROS was performed including pertinent positives and negatives as documented in the HPI.  Physical Exam :   Constitutional: NAD and appears stated age Neurological: Alert and oriented Psych: Appropriate affect and cooperative There were no vitals taken for this visit.   Comprehensive Musculoskeletal Exam:    Musculoskeletal Exam    Inspection Right Left  Skin No atrophy or  winging No atrophy or winging  Palpation    Tenderness None Lateral  Range of Motion    Flexion (passive) 170 170  Flexion (active) 170 100  Abduction 170 10  ER at the side 70 35  Can reach behind back to T12 L5  Strength     Full 4 out of 5 supraspinatus  Special Tests    Pseudoparalytic No No  Neurologic    Fires PIN, radial, median, ulnar, musculocutaneous, axillary, suprascapular, long thoracic, and spinal accessory innervated muscles. No abnormal sensibility  Vascular/Lymphatic    Radial Pulse 2+ 2+  Cervical Exam    Patient has symmetric cervical range of motion with negative Spurling's test.  Special Test: Positive Neer impingement      Imaging:   Xray (3 views left shoulder): Normal   I personally reviewed and interpreted the radiographs.   Assessment and Plan:   67 y.o. male with left shoulder pain concerning for possible supraspinatus tendon tear.  He has very limited active range of motion at this time has extremely difficult time getting past 30 degrees.  He does have positive drop arm which I discussed is significant for possible supraspinatus tear.  Given the fact that he does joy staying active and does need significant use of his arm for work I would like to obtain an MRI to rule out underlying rotator cuff tear.  I will plan to see him back following this to discuss results   I personally saw and evaluated the patient, and participated in the management and treatment plan.  Huel Cote, MD Attending Physician, Orthopedic Surgery  This document was dictated using Dragon voice recognition software. A reasonable attempt at proof reading has been made to minimize errors.

## 2023-06-18 ENCOUNTER — Ambulatory Visit
Admission: RE | Admit: 2023-06-18 | Discharge: 2023-06-18 | Disposition: A | Discharge status: Home / Self Care | Payer: Medicare Other | Source: Ambulatory Visit | Attending: Orthopaedic Surgery | Admitting: Orthopaedic Surgery

## 2023-06-18 DIAGNOSIS — G8929 Other chronic pain: Secondary | ICD-10-CM

## 2023-07-27 ENCOUNTER — Ambulatory Visit (HOSPITAL_BASED_OUTPATIENT_CLINIC_OR_DEPARTMENT_OTHER): Payer: Medicare Other | Admitting: Orthopaedic Surgery

## 2023-07-27 DIAGNOSIS — M25512 Pain in left shoulder: Secondary | ICD-10-CM | POA: Diagnosis not present

## 2023-07-27 DIAGNOSIS — G8929 Other chronic pain: Secondary | ICD-10-CM

## 2023-07-27 NOTE — Progress Notes (Signed)
 Chief Complaint: Left shoulder pain     History of Present Illness:   07/27/2023: Presents today for MRI follow-up of the left shoulder.  Overall he has been on a course of steroids at this point and does feel significantly better while he has been on the steroids.  Joseph Small is a 68 y.o. male presents today for follow-up of persistent left shoulder pain as well as weakness with overhead.  He does have a catch approximately 30 degrees on the left shoulder with difficulty with limited overhead motion.  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  He is very active.  He was recently seen for cervical spine where they were concerned for polymyalgia rheumatica.  He has been placed on steroids which is helping his lower extremity pain but he does still have persistent left pain and overhead weakness.  He is right-hand dominant    PMH/PSH/Family History/Social History/Meds/Allergies:    Past Medical History:  Diagnosis Date   Allergic rhinitis    Anxiety disorder    Hyperlipidemia    Hypertension    Insomnia    Osteoarthritis    hands,shoulders,knees   Sciatica    right leg   Past Surgical History:  Procedure Laterality Date   APPENDECTOMY  1966   LUMBAR LAMINECTOMY     Social History   Socioeconomic History   Marital status: Married    Spouse name: Not on file   Number of children: 0   Years of education: Not on file   Highest education level: Not on file  Occupational History   Not on file  Tobacco Use   Smoking status: Never   Smokeless tobacco: Not on file  Vaping Use   Vaping status: Never Used  Substance and Sexual Activity   Alcohol use: Yes    Alcohol/week: 3.0 standard drinks of alcohol    Types: 1 Glasses of wine, 1 Cans of beer, 1 Shots of liquor per week   Drug use: Never   Sexual activity: Not on file  Other Topics Concern   Not on file  Social History Narrative   Not on file   Social Drivers of Health   Financial  Resource Strain: Not on file  Food Insecurity: Not on file  Transportation Needs: Not on file  Physical Activity: Not on file  Stress: Not on file  Social Connections: Unknown (07/15/2022)   Received from [facility], [facility]   Social Network    Social Network: Not on file   Family History  Problem Relation Age of Onset   Cancer Mother    Heart attack Father 41   Heart disease Father    Heart failure Father    Heart attack Brother    Heart disease Brother 46   Heart attack Brother 45   Allergies  Allergen Reactions   Erythromycin    Penicillins    Current Outpatient Medications  Medication Sig Dispense Refill   amLODipine  (NORVASC ) 2.5 MG tablet Take 5 mg by mouth every morning.     famotidine (PEPCID) 40 MG tablet Take 40 mg by mouth daily.     finasteride (PROPECIA) 1 MG tablet Take 1 mg by mouth daily.     fluticasone (FLONASE) 50 MCG/ACT nasal spray Place 1 spray into both nostrils daily.     naproxen sodium (ANAPROX) 550 MG tablet Take 550 mg by mouth 2 (two) times daily as needed for moderate pain.     Omega-3 Fatty Acids (FISH  OIL) 1000 MG CAPS Take by mouth.     pravastatin (PRAVACHOL) 80 MG tablet Take 80 mg by mouth daily.     sildenafil (VIAGRA) 100 MG tablet Take 100 mg by mouth daily as needed for erectile dysfunction.     tadalafil (CIALIS) 5 MG tablet Take 5 mg by mouth daily.     valsartan -hydrochlorothiazide  (DIOVAN -HCT) 320-25 MG tablet Take 1 tablet by mouth every morning. 90 tablet 0   vitamin B-12 (CYANOCOBALAMIN) 100 MCG tablet Take 100 mcg by mouth daily.     zolpidem (AMBIEN) 10 MG tablet Take 10 mg by mouth at bedtime as needed for sleep.     No current facility-administered medications for this visit.   No results found.  Review of Systems:   A ROS was performed including pertinent positives and negatives as documented in the HPI.  Physical Exam :   Constitutional: NAD and appears stated age Neurological: Alert and oriented Psych:  Appropriate affect and cooperative There were no vitals taken for this visit.   Comprehensive Musculoskeletal Exam:    Musculoskeletal Exam    Inspection Right Left  Skin No atrophy or winging No atrophy or winging  Palpation    Tenderness None Lateral  Range of Motion    Flexion (passive) 170 170  Flexion (active) 170 100  Abduction 170 10  ER at the side 70 35  Can reach behind back to T12 L5  Strength     Full 4 out of 5 supraspinatus  Special Tests    Pseudoparalytic No No  Neurologic    Fires PIN, radial, median, ulnar, musculocutaneous, axillary, suprascapular, long thoracic, and spinal accessory innervated muscles. No abnormal sensibility  Vascular/Lymphatic    Radial Pulse 2+ 2+  Cervical Exam    Patient has symmetric cervical range of motion with negative Spurling's test.  Special Test: Positive Neer impingement      Imaging:   Xray (3 views left shoulder): Normal  MRI left shoulder: Interstitial tearing of the supraspinatus  I personally reviewed and interpreted the radiographs.   Assessment and Plan:   68 y.o. male with left shoulder pain in the setting of interstitial tearing.  Today's visit he does feel dramatically better while he has been on a course of steroids.  Overall given the minor and small nature of his tear I did describe that I believe it would be highly likely that he would get complete resolution while on the steroids.  I did describe that I would like him to follow-up with me should he have a recurrence of his shoulder pain while he is off of his steroids.   I personally saw and evaluated the patient, and participated in the management and treatment plan.  Elspeth Parker, MD Attending Physician, Orthopedic Surgery  This document was dictated using Dragon voice recognition software. A reasonable attempt at proof reading has been made to minimize errors.

## 2024-05-21 ENCOUNTER — Encounter: Payer: Self-pay | Admitting: Radiology
